# Patient Record
Sex: Female | Born: 1960 | Race: Black or African American | Hispanic: No | Marital: Married | Smoking: Never smoker
Health system: Southern US, Community
[De-identification: ages and names within clinical notes are randomized; demographics above are authoritative.]

## PROBLEM LIST (undated history)

## (undated) DIAGNOSIS — O2230 Deep phlebothrombosis in pregnancy, unspecified trimester: Secondary | ICD-10-CM

## (undated) DIAGNOSIS — G255 Other chorea: Secondary | ICD-10-CM

## (undated) HISTORY — PX: CARDIAC ELECTROPHYSIOLOGY STUDY AND ABLATION: SHX1294

## (undated) HISTORY — PX: ABDOMINAL HYSTERECTOMY: SHX81

---

## 1998-03-22 ENCOUNTER — Emergency Department (HOSPITAL_COMMUNITY): Admission: EM | Admit: 1998-03-22 | Discharge: 1998-03-22 | Payer: Self-pay | Admitting: Emergency Medicine

## 1998-10-08 ENCOUNTER — Emergency Department (HOSPITAL_COMMUNITY): Admission: EM | Admit: 1998-10-08 | Discharge: 1998-10-08 | Payer: Self-pay

## 2003-04-17 ENCOUNTER — Emergency Department (HOSPITAL_COMMUNITY): Admission: EM | Admit: 2003-04-17 | Discharge: 2003-04-17 | Payer: Self-pay | Admitting: Emergency Medicine

## 2004-01-12 ENCOUNTER — Inpatient Hospital Stay (HOSPITAL_COMMUNITY): Admission: EM | Admit: 2004-01-12 | Discharge: 2004-01-14 | Payer: Self-pay | Admitting: Emergency Medicine

## 2004-01-13 ENCOUNTER — Encounter: Payer: Self-pay | Admitting: Cardiology

## 2009-05-30 ENCOUNTER — Ambulatory Visit: Payer: Self-pay | Admitting: Interventional Radiology

## 2009-05-30 ENCOUNTER — Emergency Department (HOSPITAL_BASED_OUTPATIENT_CLINIC_OR_DEPARTMENT_OTHER): Admission: EM | Admit: 2009-05-30 | Discharge: 2009-05-31 | Payer: Self-pay | Admitting: Emergency Medicine

## 2010-06-24 LAB — DIFFERENTIAL
Basophils Absolute: 0 10*3/uL (ref 0.0–0.1)
Basophils Relative: 1 % (ref 0–1)
Eosinophils Absolute: 0 10*3/uL (ref 0.0–0.7)
Eosinophils Relative: 1 % (ref 0–5)
Lymphocytes Relative: 29 % (ref 12–46)
Lymphs Abs: 1 10*3/uL (ref 0.7–4.0)
Monocytes Absolute: 0.4 10*3/uL (ref 0.1–1.0)
Monocytes Relative: 11 % (ref 3–12)
Neutro Abs: 2.2 10*3/uL (ref 1.7–7.7)
Neutrophils Relative %: 58 % (ref 43–77)

## 2010-06-24 LAB — BASIC METABOLIC PANEL
BUN: 10 mg/dL (ref 6–23)
CO2: 29 mEq/L (ref 19–32)
Calcium: 8.8 mg/dL (ref 8.4–10.5)
Chloride: 104 mEq/L (ref 96–112)
Creatinine, Ser: 0.8 mg/dL (ref 0.4–1.2)
GFR calc Af Amer: >60 mL/min (ref >60)
GFR calc non Af Amer: >60 mL/min (ref >60)
Glucose, Bld: 118 mg/dL — ABNORMAL HIGH (ref 70–99)
Potassium: 4.3 mEq/L (ref 3.5–5.1)
Sodium: 140 mEq/L (ref 135–145)

## 2010-06-24 LAB — URINALYSIS, ROUTINE W REFLEX MICROSCOPIC
Bilirubin Urine: NEGATIVE
Ketones, ur: NEGATIVE mg/dL
Nitrite: NEGATIVE
Protein, ur: NEGATIVE mg/dL
Urobilinogen, UA: 1 mg/dL (ref 0.0–1.0)

## 2010-06-24 LAB — CBC
HCT: 39.9 % (ref 36.0–46.0)
Hemoglobin: 13.8 g/dL (ref 12.0–15.0)
MCHC: 34.5 g/dL (ref 30.0–36.0)
MCV: 85.7 fL (ref 78.0–100.0)
Platelets: 163 10*3/uL (ref 150–400)
RBC: 4.65 MIL/uL (ref 3.87–5.11)
RDW: 13.3 % (ref 11.5–15.5)
WBC: 3.6 10*3/uL — ABNORMAL LOW (ref 4.0–10.5)

## 2010-06-24 LAB — PREGNANCY, URINE: Preg Test, Ur: NEGATIVE

## 2010-08-20 NOTE — Discharge Summary (Signed)
Barbara Reynolds, Barbara Reynolds                ACCOUNT NO.:  [id number]   MEDICAL RECORD NO.:  [id number]          PATIENT TYPE:  INP   LOCATION:  3733                         FACILITY:  [facility]   PHYSICIAN:  Duke Salvia, M.D.  DATE OF BIRTH:  15-Jun-1960   DATE OF ADMISSION:  01/12/2004  DATE OF DISCHARGE:  01/14/2004                                 DISCHARGE SUMMARY   DISCHARGE DIAGNOSES:  1.  Admitted with paroxysmal supraventricular tachycardia with concurrent      symptoms:  Palpitations, weakness, diaphoresis.  2.  Finding of atrioventricular reciprocating tachycardia on      electrocardiogram this admission.  3.  Persistently elevated troponin-I studies with Zenith troponin-I of 2.18      on January 12, 2004 at 14:47 hours.  4.  Status post left heart catheterization January 13, 2004.  Study showing      normal coronary anatomy with ejection fraction equal to or greater than      60%.  5.  Consult by electrophysiology with electrophysiology studies,      supraventricular tachycardia ablation pending.   PROCEDURE:  1.  2-dimensional echocardiogram January 13, 2004.  This study showed      ejection fraction 55 to 65%, mild fibrocalcific changes of the aortic      root, mitral valve structure normal, left atrial size normal, right      ventricular size normal. There were no left ventricular regional wall      motion abnormalities.  2.  Left heart catheterization on January 13, 2004.  This study showed      normal coronary anatomy including left main free of disease, also      ejection fraction greater than or equal to 60% by Dr. Loraine Leriche Pulsipher.   DISPOSITION:  The patient is ready for discharge January 14, 2004.  She has  had no further dysrhythmias this hospitalization.  She has been started on  Toprol XL 25 mg daily and has maintained a sinus bradycardia since this  admission.  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   HISTORY OF PRESENT ILLNESS:  Briefly, Barbara Reynolds is a 50 year old female who  actively walks about 1.5 miles per day without chest pain or dyspnea.  She  has lost 60 pounds since March of 2005 on a Weight Watcher's schedule.  She  has had episodes of palpitations that she can feel and she says is a  pounding in her upper chest.  These initiate when she bends over and  straightens up.  They started about one month ago. Prior to today she has  had four different episodes.  The longest prior to today lasted five minutes  and they all stopped spontaneously.  After her third episode she presented  to her primary physician and a monitor was obtained for her.  At each of  this paroxysms she has not felt dizzy, short of breath or had catheter.  She  has no jaw or back pain.  She  is a little sore across her chest and back at  the end of the episode and a little fatigued.  However,  on the morning of  admission, January 12, 2004 at 6:15 in the morning, getting ready for work  she had just showered and her monitored was not connected. After bending  down for something she noted tachypalpitations.  These did not resolve after  a few minutes as the others had.  In fact, the total time was about 4 hours  until about 10:15 in the emergency room.  At that time she spontaneously  reverted to a sinus rhythm.  With this sustained dysrhythmia the patient did  have weakness and diaphoresis.  She is now a little fatigued on examination.  Her post palpitations soreness was present as before but that has now  resolved.  Electrocardiogram shows AVRT with retrograde P waves trailing  QRS.  She will be started on low dose beta blocker and seen by  Electrophysiology.   HOSPITAL COURSE:  After presenting to the emergency room at [facility] on January 12, 2004 with a sustained episode of paroxysmal  supraventricular tachycardia with mild  symptomatology, the patient was  admitted, started on Toprol XL 25 mg.  She has had no further dysrhythmias  this hospitalization, however, her troponin-I studies were elevated  persistently with a Zenith in the 2.18 at 14:40 hours on the day of her  admission.  Because of this she was scheduled for left heart  catheterization.  This was done on January 13, 2004 with a finding of normal  coronary anatomy and preserved left ventricular ejection fraction.  A 2-  dimensional echocardiogram confirms ejection fraction findings and shows on  evidence of wall motion abnormalities.  The patient has been seen by Dr.  Graciela Husbands this hospitalization.  He has determined that her dysrhythmia is  amenable to radiofrequency catheter ablation but he has offered her the  option of medical therapy or ablation.  The patient is still pondering this  at the time of this dictation and will go home January 14, 2004 with the  medications as follows:   DISCHARGE MEDICATIONS:  1.  Aspirin 81 mg daily.  2.  Toprol XL 25 mg daily.   ACTIVITY:  Patient is asked to avoid straining or heavy lifting for the next  two days.  She may shower.  She is to call [phone] if she experiences pain  or swelling at the catheterization site.   FOLLOW UP:  Patient will have a two week follow up appointment post  catheterization at the office of [facility] in [place] an Dr.  Graciela Husbands will schedule her if she is amenable for radiofrequency ablation in  the future.       GM/MEDQ  D:  01/14/2004  T:  01/14/2004  Job:  [id number]   cc:   Dr. Asencion Gowda  [place]

## 2010-08-20 NOTE — Cardiovascular Report (Signed)
NAMEFRANNIE, SHEDRICK                ACCOUNT NO.:  [id number]   MEDICAL RECORD NO.:  [id number]          PATIENT TYPE:  INP   LOCATION:  3733                         FACILITY:  [facility]   PHYSICIAN:  Carole Binning, M.D. LHCDATE OF BIRTH:  [birth year]   DATE OF PROCEDURE:  01/13/2004  DATE OF DISCHARGE:                              CARDIAC CATHETERIZATION   PROCEDURE PERFORMED:  Left heart catheterization with coronary angiography  and left ventriculography.   INDICATION:  Ms. Christell Constant is a 50 year old woman who presented with  supraventricular tachycardia.  She also had elevated cardiac markers  including elevated CK-MB and troponin I.  She was thus referred for cardiac  catheterization to rule out coronary artery disease.   PROCEDURE:  A 6 French sheath was placed in the right femoral artery.  Coronary angiography was performed with standard Judkins 6 French catheters.  Left ventriculography was performed with an angled pigtail catheter.  Contrast was Omnipaque.  There were no complications.   RESULTS/HEMODYNAMICS:  Left ventricular pressure 122/15, aortic pressure  124/74.  There was no aortic valve gradient.   Left ventriculography -- wall motion is normal, ejection fraction estimated  at greater than or equal to 60%.  There was no mitral regurgitation.   Coronary arteriography (right dominant).   Left main is normal.   Left anterior descending artery gives rise to a large first diagonal branch  and a small second diagonal branch.  The LAD is normal.   Left circumflex gives rise to a small first obtuse marginal branch and a  large second obtuse marginal branch.  The left circumflex is normal.   Right coronary artery is a dominant vessel giving rise to a small posterior  descending artery and three small posterolateral branches.  The right  coronary artery is normal.   IMPRESSIONS:  1.  Normal left ventricular systolic function.  2.  Angiographically normal coronary  arteries.       MWP/MEDQ  D:  01/13/2004  T:  01/13/2004  Job:  [id number]   cc:   Asencion Gowda, MD  [facility]   Duke Salvia, M.D.

## 2010-08-20 NOTE — H&P (Signed)
NAMECHESNEE, FLOREN NO.:  [id number]   MEDICAL RECORD NO.:  [id number]          PATIENT TYPE:  INP   LOCATION:  1849                         FACILITY:  [facility]   PHYSICIAN:  Jonelle Sidle, M.D. LHCDATE OF BIRTH:  [birth year]   DATE OF ADMISSION:  01/12/2004  DATE OF DISCHARGE:                                HISTORY & PHYSICAL   PRIMARY CARE PHYSICIAN:  Asencion Gowda, M.D. at [facility].   CHIEF COMPLAINT:  Rapid palpitations.   HISTORY OF PRESENT ILLNESS:  Barbara Reynolds is a very pleasant 50 year old woman  with a history of obesity and more recently complaints of palpitations in an  intermittent pattern over the last month.  She states that she has been  experiencing this sensation of rapid palpitations, typically only lasting  for a few minutes at a time when she bends over to pick up something.  She  has not experienced any of these symptoms at any other time and otherwise is  having no exertional chest discomfort, shortness of breath, or palpitations.  The first few episodes she experienced lasting anywhere from one minute to  five minutes and she mentioned this to Dr. Dimple Casey who provided her with an  event recorder which she has been using the last two weeks without any  recurrent symptoms until this morning.   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  This particular episode lasted for  quite some time ultimately resulting in weakness and diaphoresis.   She presented to the emergency department where an electrocardiogram at 9:12  this morning showed a narrow complex tachycardia at 158 beats per minute.  The patient spontaneously converted to normal sinus rhythm without receiving  any medications, although it was planned for her to receive a beta blocker.  In reviewing her resting  electrocardiogram, she does have a short PR  interval of 152 msec with somewhat of a slurring of the initial portion of  the QRS reminiscent of a delta wave, although not entirely classic for this.  Her tracing during tachycardia does show intermittent beats that look to be  conducted through a different pathway and one does note retrograde P-waves  just outside the QRS raising the possibility of an accessory pathway just  outside the AV node.   Barbara Reynolds has not had any chest discomfort as part of her syndrome and  states that she has been working with [employer] and lost approximately  60 pounds, also associated with regular exercise.  She walks a mile and a  half most days of the week is bothered by no symptoms.  Her initial troponin  I level is mildly elevated at 0.13 with a CK-MB of 3.9 and a myoglobin of  143.  She is entirely symptom-free at this time.   ALLERGIES:  CODEINE and ERYTHROMYCIN.   MEDICATIONS:  None chronically.   PAST MEDICAL HISTORY:  1.  Status post tubal ligation.  2.  No clear history of type 2 diabetes mellitus, hypertension,      dyslipidemia, previously documented dysrhythmia, but no coronary artery      disease or congestive heart failure.  3.  The patient tells me that she recently had blood work obtained by her      primary care physician and was told that her thyroid status was normal.   SOCIAL HISTORY:  The patient lives in [place] with her husband.  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  She has no significant tobacco or  alcohol use history.  She denies significant caffeine use.   FAMILY HISTORY:  Significant for hypertension and type 2 diabetes mellitus.  No known dysrhythmias.   REVIEW OF SYMPTOMS:  As in history of present illness.  She has rare  indigestion.  Otherwise systems are negative.   PHYSICAL EXAMINATION:  VITAL SIGNS:  Temperature is 97.4, heart rate is 72  and regular, respirations are 20, blood pressure is 108/60.   Oxygen  saturation is 100% on two liters nasal cannula.  GENERAL:  This is an obese woman seated in bed in no acute distress.  HEENT:  Conjunctivae is normal.  Oropharynx is clear.  NECK:  Supple without elevated jugular venous pressure or lateral carotid  bruits.  No thyromegaly is noted.  LUNGS:  Clear to auscultation bilaterally.  CARDIOVASCULAR:  Regular rate and rhythm without loud murmur or S3 gallop.  There is no pericardial rub.  ABDOMEN:  Soft with normal active bowel sounds.  There is no tenderness to  palpation.  EXTREMITIES:  No pitting edema.  Peripheral pulses are 2+.  SKIN:  No ulcerative changes are noted.  MUSCULOSKELETAL:  No kyphosis noted.  NEUROLOGICAL:  The patient is alert and oriented x 3.   LABORATORY DATA:  Chest x-ray is currently pending.   Initial troponin I is 0.14, hemoglobin 16, hematocrit 47.  Sodium 138,  potassium 4.0, BUN 14, creatinine pending.  Glucose 88.   IMPRESSION:  1.  Narrow complex supraventricular tachycardia likely via an accessory      pathway as described:  I suspect that this is extranodal based on the      resting tachycardia tracings.  Most recent event was prolonged lasting      at least three hours and symptomatic and I suspect that the mildly      elevated troponin level is due to this rather than an acute coronary      syndrome.  Reportedly, thyroid status was evaluated within the last few      weeks and found to be normal per the patient.  2.  Obesity with 60 pound weight loss via diet and exercise over the last      six months.   PLAN:  1.  We will admit the patient to telemetry and complete a full set of      cardiac markers to follow trend in troponin I.  2.  We will ask electrophysiology to evaluate the patient with new tracings      and discuss treatment options.  Ischemic testing will be needed as well      given troponin I levels, although can likely consider a Cardiolite. 3.  We will continue Toprol for the time  being.  Further plans to follow.       SGM/MEDQ  D:  01/12/2004  T:  01/12/2004  Job:  [id number]

## 2016-06-23 ENCOUNTER — Emergency Department (HOSPITAL_BASED_OUTPATIENT_CLINIC_OR_DEPARTMENT_OTHER)
Admission: EM | Admit: 2016-06-23 | Discharge: 2016-06-23 | Disposition: A | Discharge status: Home / Self Care | Payer: BC Managed Care – PPO | Attending: Emergency Medicine | Admitting: Emergency Medicine

## 2016-06-23 ENCOUNTER — Encounter (HOSPITAL_BASED_OUTPATIENT_CLINIC_OR_DEPARTMENT_OTHER): Payer: Self-pay | Admitting: Emergency Medicine

## 2016-06-23 ENCOUNTER — Emergency Department (HOSPITAL_BASED_OUTPATIENT_CLINIC_OR_DEPARTMENT_OTHER): Payer: BC Managed Care – PPO

## 2016-06-23 DIAGNOSIS — R0789 Other chest pain: Secondary | ICD-10-CM | POA: Insufficient documentation

## 2016-06-23 DIAGNOSIS — R05 Cough: Secondary | ICD-10-CM | POA: Insufficient documentation

## 2016-06-23 DIAGNOSIS — Z7901 Long term (current) use of anticoagulants: Secondary | ICD-10-CM | POA: Insufficient documentation

## 2016-06-23 HISTORY — DX: Deep phlebothrombosis in pregnancy, unspecified trimester: O22.30

## 2016-06-23 HISTORY — DX: Other chorea: G25.5

## 2016-06-23 LAB — BASIC METABOLIC PANEL
Anion gap: 6 (ref 5–15)
BUN: 22 mg/dL — AB (ref 6–20)
CALCIUM: 8.9 mg/dL (ref 8.9–10.3)
CO2: 26 mmol/L (ref 22–32)
Chloride: 110 mmol/L (ref 101–111)
Creatinine, Ser: 0.88 mg/dL (ref 0.44–1.00)
GFR calc Af Amer: >60 mL/min (ref >60)
GLUCOSE: 95 mg/dL (ref 65–99)
Potassium: 4.1 mmol/L (ref 3.5–5.1)
Sodium: 142 mmol/L (ref 135–145)

## 2016-06-23 LAB — CBC WITH DIFFERENTIAL/PLATELET
Basophils Absolute: 0 10*3/uL (ref 0.0–0.1)
Basophils Relative: 0 %
Eosinophils Absolute: 0.1 10*3/uL (ref 0.0–0.7)
Eosinophils Relative: 2 %
HEMATOCRIT: 42.1 % (ref 36.0–46.0)
HEMOGLOBIN: 14.1 g/dL (ref 12.0–15.0)
LYMPHS ABS: 2.5 10*3/uL (ref 0.7–4.0)
LYMPHS PCT: 35 %
MCH: 28.7 pg (ref 26.0–34.0)
MCHC: 33.5 g/dL (ref 30.0–36.0)
MCV: 85.6 fL (ref 78.0–100.0)
MONOS PCT: 10 %
Monocytes Absolute: 0.7 10*3/uL (ref 0.1–1.0)
NEUTROS ABS: 3.8 10*3/uL (ref 1.7–7.7)
Neutrophils Relative %: 53 %
Platelets: 187 10*3/uL (ref 150–400)
RBC: 4.92 MIL/uL (ref 3.87–5.11)
RDW: 14.6 % (ref 11.5–15.5)
WBC: 7.1 10*3/uL (ref 4.0–10.5)

## 2016-06-23 LAB — D-DIMER, QUANTITATIVE (NOT AT ARMC)

## 2016-06-23 LAB — PROTIME-INR
INR: 3.28
Prothrombin Time: 34.1 seconds — ABNORMAL HIGH (ref 11.4–15.2)

## 2016-06-23 NOTE — ED Notes (Signed)
ED Provider at bedside. 

## 2016-06-23 NOTE — ED Triage Notes (Signed)
Patient states that she woke up about an hour ago from the pain in her chest. The patient reports that it is aching in her chest - history of DVT on coumadin. The patient denies any N/V/D or SOB - the patient reports a recent cold

## 2016-06-23 NOTE — ED Provider Notes (Signed)
[facility] Provider Note   CSN: [id number] Arrival date & time: 06/23/16  0447     History   Chief Complaint Chief Complaint  Patient presents with  . Chest Pain    HPI Barbara Reynolds is a 56 y.o. female with a past medical history of DVT, currently on Coumadin, presenting today with chest pain. Patient states she woke up an hour and half prior to arrival with sharp chest pain across the front of her chest. She has mild chest pain in her back as well. She denies any shortness of breath or pleuritic chest pain. No vomiting or sweating associated with this. This is in the setting of recent viral URI with productive cough. She is concerned for DVT may have moved to a PE. She has been compliant with her Coumadin and her last INR was 2.8, this was done 8 days ago. She is due to have this checked again next week. Currently the pain has improved without any acute interventions. Patient has no further complaints.  10 Systems reviewed and are negative for acute change except as noted in the HPI.    HPI  Past Medical History:  Diagnosis Date  . DVT (deep vein thrombosis) in pregnancy (HCC)   . 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   Past Surgical History:  Procedure Laterality Date  . ABDOMINAL HYSTERECTOMY    . CARDIAC ELECTROPHYSIOLOGY STUDY AND ABLATION      OB History    No data available       Home Medications    Prior to Admission medications   Medication Sig Start Date End Date Taking? Authorizing Provider  warfarin (COUMADIN) 10 MG tablet Take 10 mg by mouth daily.   Yes Historical Provider, MD    Family History History reviewed. No pertinent family history.  Social History Social History  Substance Use Topics  . Smoking status: Never Smoker  . Smokeless tobacco: Never Used  . Alcohol use No     Allergies   Asa [aspirin]; Codeine; Decongestant [oxymetazoline]; and  Erythromycin   Review of Systems Review of Systems   Physical Exam Updated Vital Signs BP 131/90 (BP Location: Right Arm)   Pulse 84   Temp 98.3 F (36.8 C) (Oral)   Resp 20   SpO2 100%   Physical Exam  Constitutional: She is oriented to person, place, and time. She appears well-developed and well-nourished. No distress.  HENT:  Head: Normocephalic and atraumatic.  Nose: Nose normal.  Mouth/Throat: Oropharynx is clear and moist. No oropharyngeal exudate.  Eyes: Conjunctivae and EOM are normal. Pupils are equal, round, and reactive to light. No scleral icterus.  Neck: Normal range of motion. Neck supple. No JVD present. No tracheal deviation present. No thyromegaly present.  Cardiovascular: Normal rate, regular rhythm and normal heart sounds.  Exam reveals no gallop and no friction rub.   No murmur heard. Pulmonary/Chest: Effort normal and breath sounds normal. No respiratory distress. She has no wheezes. She exhibits no tenderness.  Abdominal: Soft. Bowel sounds are normal. She exhibits no distension and no mass. There is no tenderness. There is no rebound and no guarding.  Musculoskeletal: Normal range of motion. She exhibits no edema or tenderness.  Lymphadenopathy:    She has no cervical adenopathy.  Neurological: She is alert and oriented to person, place, and time. No cranial nerve deficit. She exhibits normal muscle tone.  Skin: Skin is  warm and dry. No rash noted. No erythema. No pallor.  Nursing note and vitals reviewed.    ED Treatments / Results  Labs (all labs ordered are listed, but only abnormal results are displayed) Labs Reviewed  BASIC METABOLIC PANEL - Abnormal; Notable for the following:       Result Value   BUN 22 (*)    All other components within normal limits  PROTIME-INR - Abnormal; Notable for the following:    Prothrombin Time 34.1 (*)    All other components within normal limits  CBC WITH DIFFERENTIAL/PLATELET  D-DIMER, QUANTITATIVE ([facility])    EKG  EKG Interpretation  Date/Time:  Thursday June 23 2016 04:55:29 EDT Ventricular Rate:  91 PR Interval:    QRS Duration: 98 QT Interval:  345 QTC Calculation: 425 R Axis:   64 Text Interpretation:  Sinus rhythm Probable left atrial enlargement rate is faster Confirmed by Erroll Lunani, Mckenlee Mangham Ayokunle [phone]([id number]) on 06/23/2016 5:07:27 AM       Radiology Dg Chest 2 View  Result Date: 06/23/2016 CLINICAL DATA:  56 year old female with chest pain and productive cough. History of DVT. EXAM: CHEST  2 VIEW COMPARISON:  Chest radiograph dated 05/30/2009 FINDINGS: The lungs are clear. There is no pleural effusion or pneumothorax the cardiac silhouette is within normal limits. There is degenerative changes of the spine. No acute osseous pathology. IMPRESSION: No active cardiopulmonary disease. Electronically Signed   By: Elgie CollardArash  Radparvar M.D.   On: 06/23/2016 05:35    Procedures Procedures (including critical care time)  Medications Ordered in ED Medications - No data to display   Initial Impression / Assessment and Plan / ED Course  I have reviewed the triage vital signs and the nursing notes.  Pertinent labs & imaging results that were available during my care of the patient were reviewed by me and considered in my medical decision making (see chart for details).     Patient presents to the emergency department for chest pain. Her history is not consistent with ACS. EKG does not show any signs of ischemia or signs of pulmonary embolism. She is compliant with her Coumadin and thus is on proper treatment. Will obtain a d-dimer, Patient may need a CT scan if this is positive. Chest x-ray is pending currently. We'll continue to monitor. She has no tachycardia or hypoxia currently.  6:00 AM dimer is negative. She has follow up in 1 day for INR check.  Advised to hold INR for one dose because it is slightly high. She demonstrates good understanding of the plan. She appears well and in NAD.  VS remain within her normal limits and she I ssafe for DC.  Final Clinical Impressions(s) / ED Diagnoses   Final diagnoses:  Chest wall pain    New Prescriptions New Prescriptions   No medications on file     Tomasita CrumbleAdeleke Christl Fessenden, MD 06/23/16 [phone]

## 2017-04-26 ENCOUNTER — Telehealth: Payer: Self-pay | Admitting: *Deleted

## 2017-04-26 NOTE — Telephone Encounter (Signed)
Did not need this encounter °

## 2018-11-04 ENCOUNTER — Emergency Department (HOSPITAL_BASED_OUTPATIENT_CLINIC_OR_DEPARTMENT_OTHER)
Admission: EM | Admit: 2018-11-04 | Discharge: 2018-11-04 | Disposition: A | Discharge status: Home / Self Care | Payer: BC Managed Care – PPO | Attending: Emergency Medicine | Admitting: Emergency Medicine

## 2018-11-04 ENCOUNTER — Encounter (HOSPITAL_BASED_OUTPATIENT_CLINIC_OR_DEPARTMENT_OTHER): Payer: Self-pay | Admitting: Emergency Medicine

## 2018-11-04 ENCOUNTER — Emergency Department (HOSPITAL_BASED_OUTPATIENT_CLINIC_OR_DEPARTMENT_OTHER): Payer: BC Managed Care – PPO

## 2018-11-04 ENCOUNTER — Other Ambulatory Visit: Payer: Self-pay

## 2018-11-04 DIAGNOSIS — R072 Precordial pain: Secondary | ICD-10-CM | POA: Diagnosis not present

## 2018-11-04 DIAGNOSIS — Y9389 Activity, other specified: Secondary | ICD-10-CM | POA: Insufficient documentation

## 2018-11-04 DIAGNOSIS — R0789 Other chest pain: Secondary | ICD-10-CM | POA: Diagnosis not present

## 2018-11-04 DIAGNOSIS — R079 Chest pain, unspecified: Secondary | ICD-10-CM | POA: Diagnosis present

## 2018-11-04 DIAGNOSIS — X500XXA Overexertion from strenuous movement or load, initial encounter: Secondary | ICD-10-CM | POA: Diagnosis not present

## 2018-11-04 DIAGNOSIS — S29012A Strain of muscle and tendon of back wall of thorax, initial encounter: Secondary | ICD-10-CM | POA: Diagnosis not present

## 2018-11-04 DIAGNOSIS — Z7901 Long term (current) use of anticoagulants: Secondary | ICD-10-CM | POA: Insufficient documentation

## 2018-11-04 DIAGNOSIS — S46819A Strain of other muscles, fascia and tendons at shoulder and upper arm level, unspecified arm, initial encounter: Secondary | ICD-10-CM

## 2018-11-04 DIAGNOSIS — Z79899 Other long term (current) drug therapy: Secondary | ICD-10-CM | POA: Diagnosis not present

## 2018-11-04 DIAGNOSIS — Y9289 Other specified places as the place of occurrence of the external cause: Secondary | ICD-10-CM | POA: Insufficient documentation

## 2018-11-04 DIAGNOSIS — Y999 Unspecified external cause status: Secondary | ICD-10-CM | POA: Insufficient documentation

## 2018-11-04 LAB — CBC
HCT: 42.8 % (ref 36.0–46.0)
Hemoglobin: 13.6 g/dL (ref 12.0–15.0)
MCH: 28.5 pg (ref 26.0–34.0)
MCHC: 31.8 g/dL (ref 30.0–36.0)
MCV: 89.7 fL (ref 80.0–100.0)
Platelets: 160 10*3/uL (ref 150–400)
RBC: 4.77 MIL/uL (ref 3.87–5.11)
RDW: 14.6 % (ref 11.5–15.5)
WBC: 4.7 10*3/uL (ref 4.0–10.5)
nRBC: 0 % (ref 0.0–0.2)

## 2018-11-04 LAB — TROPONIN I (HIGH SENSITIVITY)
Troponin I (High Sensitivity): 3 ng/L (ref <18)
Troponin I (High Sensitivity): 3 ng/L (ref <18)

## 2018-11-04 LAB — BASIC METABOLIC PANEL
Anion gap: 8 (ref 5–15)
BUN: 15 mg/dL (ref 6–20)
CO2: 24 mmol/L (ref 22–32)
Calcium: 9 mg/dL (ref 8.9–10.3)
Chloride: 111 mmol/L (ref 98–111)
Creatinine, Ser: 0.75 mg/dL (ref 0.44–1.00)
GFR calc Af Amer: >60 mL/min (ref >60)
GFR calc non Af Amer: >60 mL/min (ref >60)
Glucose, Bld: 100 mg/dL — ABNORMAL HIGH (ref 70–99)
Potassium: 3.9 mmol/L (ref 3.5–5.1)
Sodium: 143 mmol/L (ref 135–145)

## 2018-11-04 MED ORDER — METHOCARBAMOL 750 MG PO TABS: 750.0000 mg | ORAL_TABLET | Freq: Three times a day (TID) | ORAL | 0 refills | Status: AC | PRN

## 2018-11-04 NOTE — ED Triage Notes (Signed)
Generalized chest pain radiating into shoulders and back x 1 week. Endorses dyspnea with exertion.

## 2018-11-04 NOTE — ED Notes (Signed)
2 unsuccessful IV attempts.

## 2018-11-04 NOTE — ED Provider Notes (Signed)
[facility] EMERGENCY DEPARTMENT Provider Note   CSN: [id number] Arrival date & time: 11/04/18  1033     History   Chief Complaint Chief Complaint  Patient presents with  . Chest Pain    HPI Barbara Reynolds is a 58 y.o. female.     Patient c/o pain across bil trapezius area and upper chest for the past couple days. Pain gradual onset, constant, moderate, non radiating, worse w heavy lifting and palpation area. Pain is not pleuritic. No exertional chest pain or discomfort. No unusual doe or fatigue. No sob. No nv or diaphoresis. No leg pain or swelling. No recent surgery, immobility, trauma or travel. No heartburn. States has been doing more lifting as of late, but denies specific injury or strain. No cough or uri symptoms. No known covid+ exposure. No skin changes, rash or swelling to area.   The history is provided by the patient.  Chest Pain Associated symptoms: back pain   Associated symptoms: no abdominal pain, no cough, no fever, no headache, no nausea, no numbness, no shortness of breath, no vomiting and no weakness     Past Medical History:  Diagnosis Date  . DVT (deep vein thrombosis) in pregnancy   . 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   Past Surgical History:  Procedure Laterality Date  . ABDOMINAL HYSTERECTOMY    . CARDIAC ELECTROPHYSIOLOGY STUDY AND ABLATION       OB History   No obstetric history on file.      Home Medications    Prior to Admission medications   Medication Sig Start Date End Date Taking? Authorizing Provider  topiramate (TOPAMAX) 50 MG tablet Take 50 mg by mouth 2 (two) times daily.   Yes [provider]  warfarin (COUMADIN) 10 MG tablet Take 10 mg by mouth daily.    [provider]    Family History No family history on file.  Social History Social History   Tobacco Use  . Smoking status: Never Smoker  . Smokeless  tobacco: Never Used  Substance Use Topics  . Alcohol use: No  . Drug use: No     Allergies   Asa [aspirin], Codeine, Decongestant [oxymetazoline], and Erythromycin   Review of Systems Review of Systems  Constitutional: Negative for chills and fever.  HENT: Negative for sore throat.   Eyes: Negative for redness.  Respiratory: Negative for cough and shortness of breath.   Cardiovascular: Positive for chest pain. Negative for leg swelling.  Gastrointestinal: Negative for abdominal pain, nausea and vomiting.  Genitourinary: Negative for flank pain.  Musculoskeletal: Positive for back pain. Negative for neck pain.  Skin: Negative for rash.  Neurological: Negative for weakness, numbness and headaches.  Hematological: Does not bruise/bleed easily.  Psychiatric/Behavioral: Negative for confusion.     Physical Exam Updated Vital Signs BP 136/67 (BP Location: Right Arm)   Pulse 66   Temp 98.3 F (36.8 C) (Oral)   Resp 20   Wt 122.6 kg   Physical Exam Vitals signs and nursing note reviewed.  Constitutional:      Appearance: Normal appearance. She is well-developed.  HENT:     Head: Atraumatic.     Nose: Nose normal.     Mouth/Throat:     Mouth: Mucous membranes are moist.  Eyes:     General: No scleral icterus.    Conjunctiva/sclera: Conjunctivae normal.  Neck:     Musculoskeletal:  Normal range of motion and neck supple. No neck rigidity or muscular tenderness.     Trachea: No tracheal deviation.  Cardiovascular:     Rate and Rhythm: Normal rate and regular rhythm.     Pulses: Normal pulses.     Heart sounds: Normal heart sounds. No murmur. No friction rub. No gallop.   Pulmonary:     Effort: Pulmonary effort is normal. No respiratory distress.     Breath sounds: Normal breath sounds.     Comments: +chest and bil trapezius tenderness reproducing symptoms.  Chest:     Chest wall: Tenderness present.  Abdominal:     General: Bowel sounds are normal. There is no  distension.     Palpations: Abdomen is soft.     Tenderness: There is no abdominal tenderness. There is no guarding.  Genitourinary:    Comments: No cva tenderness.  Musculoskeletal:        General: No swelling or tenderness.     Right lower leg: No edema.     Left lower leg: No edema.     Comments: C/T spine non tender. Aligned. bil trap muscular tenderness.   Skin:    General: Skin is warm and dry.     Findings: No rash.  Neurological:     Mental Status: She is alert.     Comments: Alert, speech normal.   Psychiatric:        Mood and Affect: Mood normal.      ED Treatments / Results  Labs (all labs ordered are listed, but only abnormal results are displayed) Results for orders placed or performed during the hospital encounter of 11/04/18  Basic metabolic panel  Result Value Ref Range   Sodium 143 135 - 145 mmol/L   Potassium 3.9 3.5 - 5.1 mmol/L   Chloride 111 98 - 111 mmol/L   CO2 24 22 - 32 mmol/L   Glucose, Bld 100 (H) 70 - 99 mg/dL   BUN 15 6 - 20 mg/dL   Creatinine, Ser 1.610.75 0.44 - 1.00 mg/dL   Calcium 9.0 8.9 - 09.610.3 mg/dL   GFR calc non Af Amer >60 >60 mL/min   GFR calc Af Amer >60 >60 mL/min   Anion gap 8 5 - 15  CBC  Result Value Ref Range   WBC 4.7 4.0 - 10.5 K/uL   RBC 4.77 3.87 - 5.11 MIL/uL   Hemoglobin 13.6 12.0 - 15.0 g/dL   HCT 04.542.8 40.936.0 - 81.146.0 %   MCV 89.7 80.0 - 100.0 fL   MCH 28.5 26.0 - 34.0 pg   MCHC 31.8 30.0 - 36.0 g/dL   RDW 91.414.6 78.211.5 - 95.615.5 %   Platelets 160 150 - 400 K/uL   nRBC 0.0 0.0 - 0.2 %  Troponin I (High Sensitivity)  Result Value Ref Range   Troponin I (High Sensitivity) 3 <18 ng/L  Troponin I (High Sensitivity)  Result Value Ref Range   Troponin I (High Sensitivity) 3 <18 ng/L    EKG EKG Interpretation  Date/Time:  Sunday November 04 2018 10:44:39 EDT Ventricular Rate:  61 PR Interval:    QRS Duration: 99 QT Interval:  402 QTC Calculation: 405 R Axis:   81 Text Interpretation:  Sinus rhythm No significant change  since last tracing Confirmed by Cathren LaineSteinl, Maryana Pittmon ([id number]) on 11/04/2018 10:51:19 AM   Radiology Dg Chest 2 View  Result Date: 11/04/2018 CLINICAL DATA:  Chest pain and dyspnea for 6 days EXAM: CHEST - 2 VIEW COMPARISON:  June 23, 2016 FINDINGS: The heart size and mediastinal contours are within normal limits. Both lungs are clear. The visualized skeletal structures are. Stable. IMPRESSION: No active cardiopulmonary disease. Electronically Signed   By: Abelardo Diesel M.D.   On: 11/04/2018 11:50    Procedures Procedures (including critical care time)  Medications Ordered in ED Medications - No data to display   Initial Impression / Assessment and Plan / ED Course  I have reviewed the triage vital signs and the nursing notes.  Pertinent labs & imaging results that were available during my care of the patient were reviewed by me and considered in my medical decision making (see chart for details).  Iv ns. Ecg. Cxr. Labs.   Reviewed nursing notes and prior charts for additional history.   Labs reviewed by me - trop is normal.   CXR reviewed by me - no pna.   Motrin po. Robaxin po.   Delta trop is normal.  Symptoms felt most c/w msk pain.   Rec pcp f/u.  Return precautions provided.     Final Clinical Impressions(s) / ED Diagnoses   Final diagnoses:  None    ED Discharge Orders    None       Lajean Saver, MD 11/04/18 [phone]

## 2018-11-04 NOTE — Discharge Instructions (Addendum)
It was our pleasure to provide your ER care today - we hope that you feel better.  Take ibuprofen or aleve as need. You may also take robaxin as need for muscle pain/spasm - no driving when taking.  Follow up with primary care doctor in the next 1-2 weeks.   Return to ER if worse, new symptoms, fevers, trouble breathing, persistent/recurrent chest pain, other concern.

## 2018-11-04 NOTE — ED Notes (Signed)
Delay in xray. Second attempt to get pt. RN drawing labs.

## 2020-04-06 IMAGING — CR CHEST - 2 VIEW
2 series · 2 of 2 positions shown · non-contrast
Comparison: June 23, 2016

CLINICAL DATA: Chest pain and dyspnea for 6 days

EXAM:
CHEST - 2 VIEW

[w chest pa]
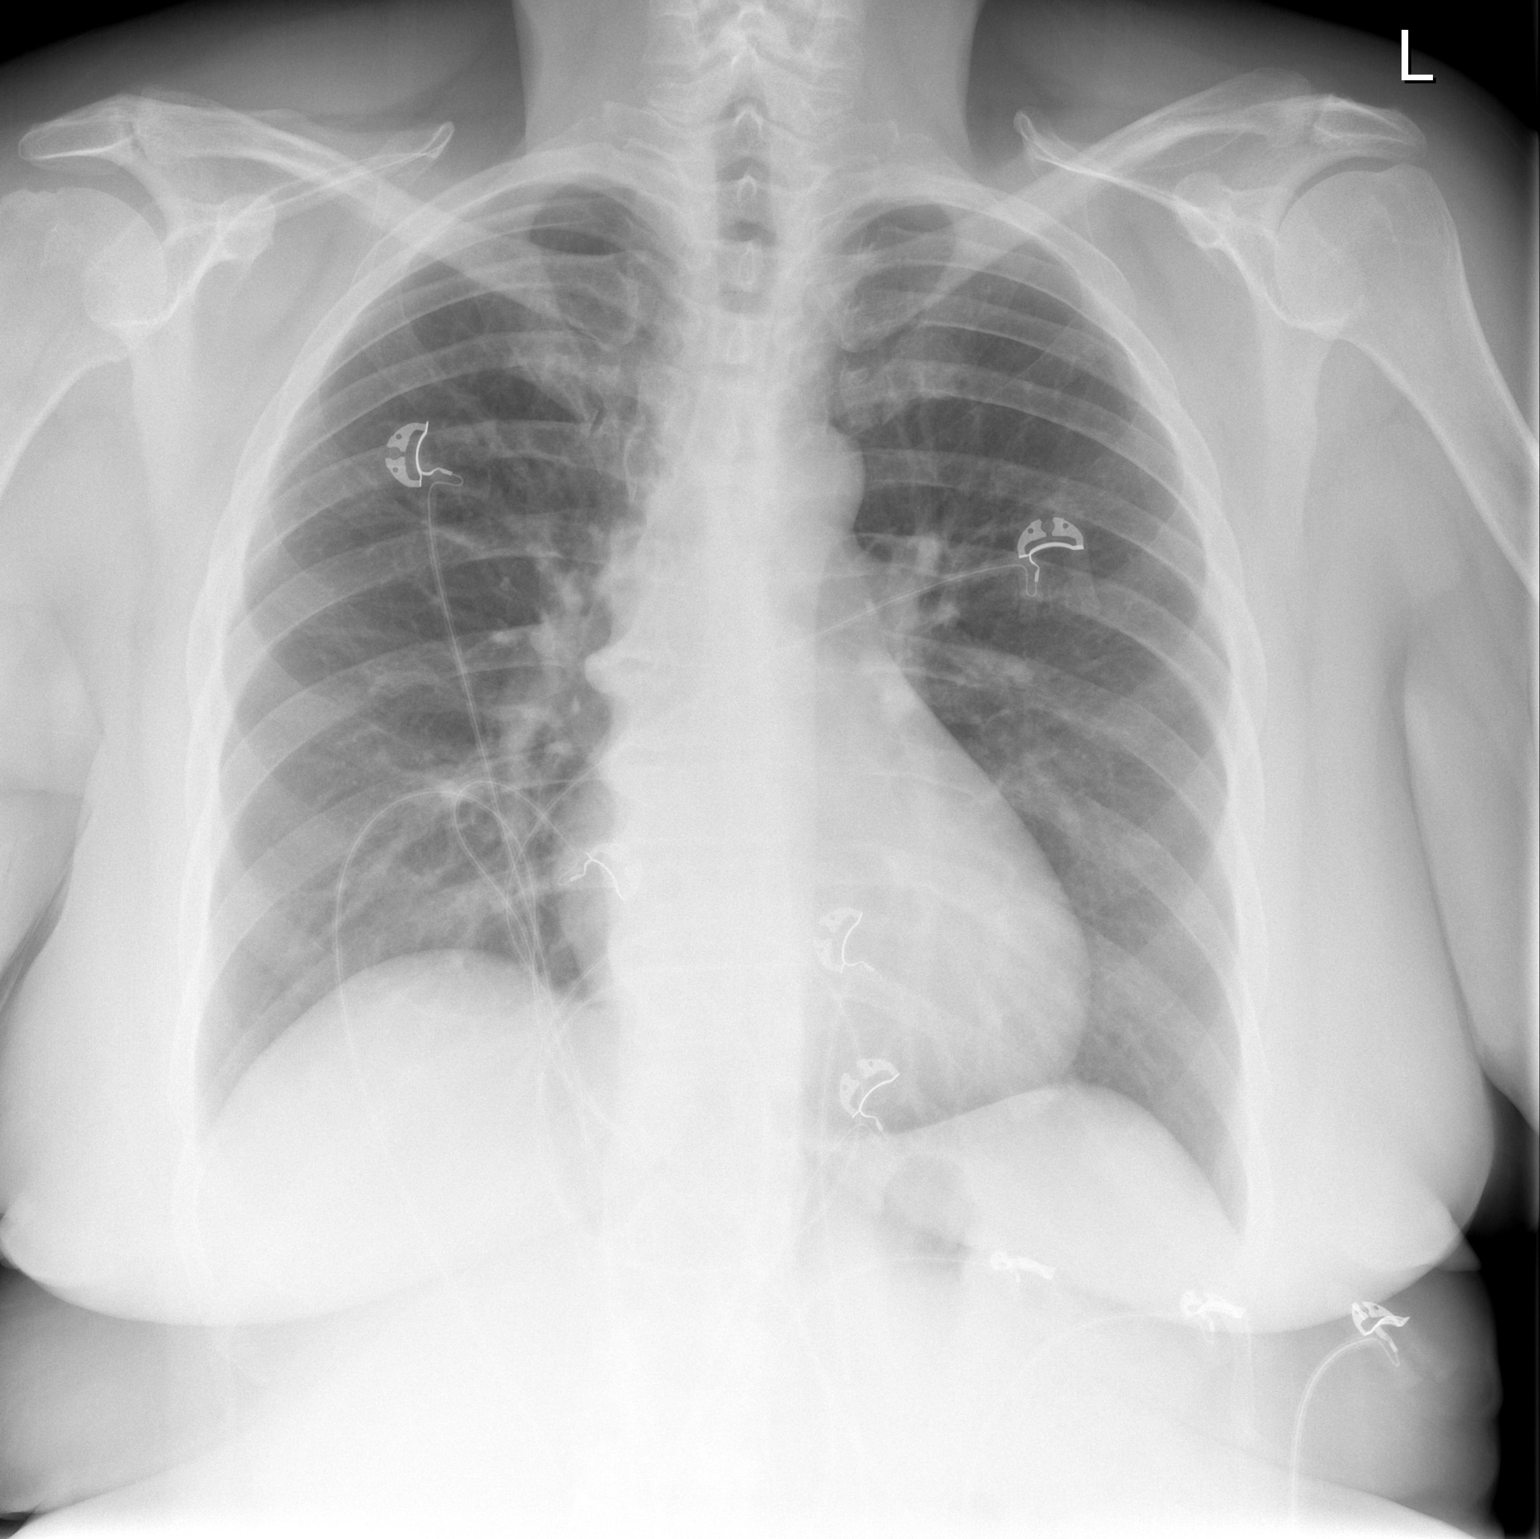

[w chest lat]
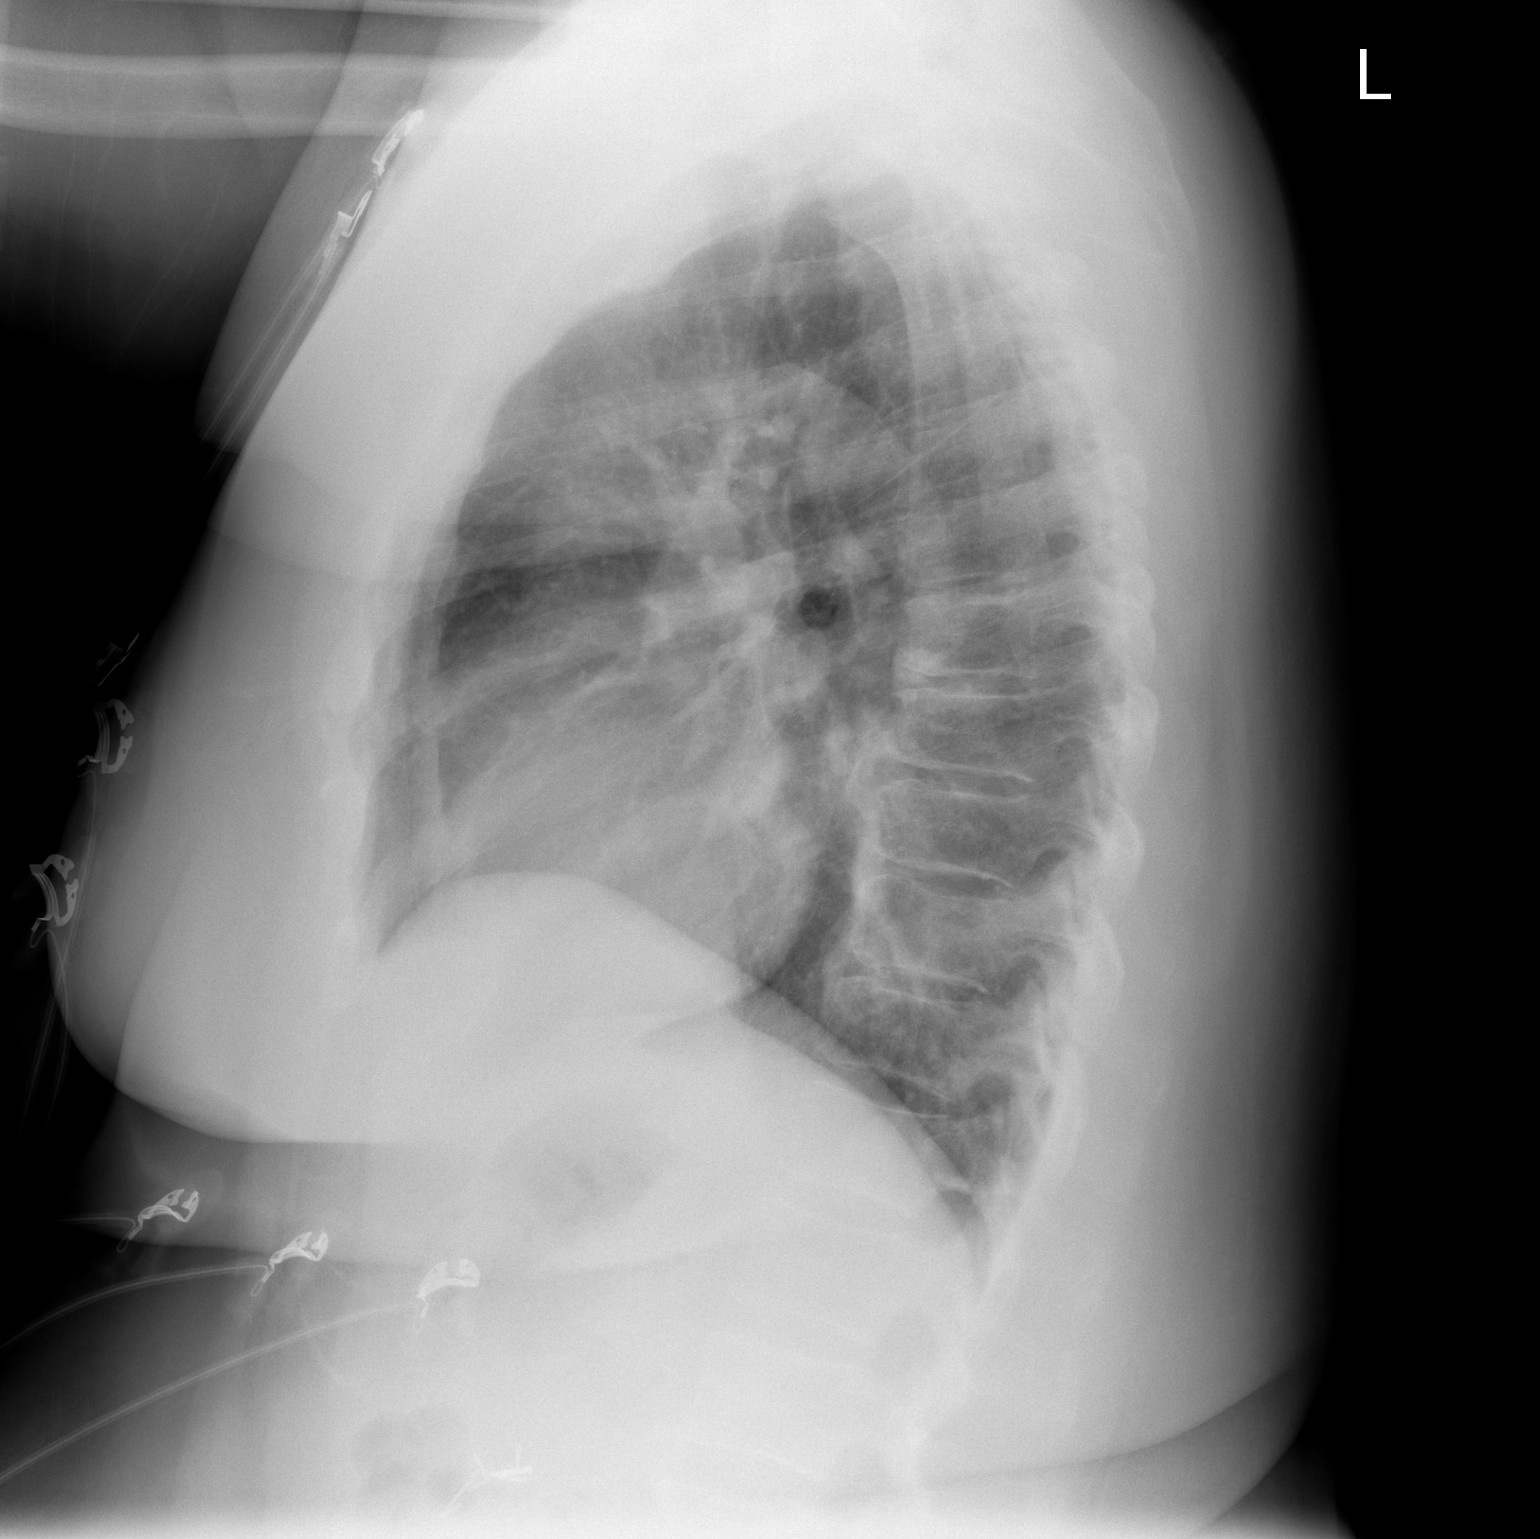

[2 of 2 positions shown; findings below may reference images not displayed]

FINDINGS: The heart size and mediastinal contours are within normal limits.
Both lungs are clear. The visualized skeletal structures are.
Stable.
IMPRESSION: No active cardiopulmonary disease.

## 2022-01-26 ENCOUNTER — Other Ambulatory Visit (HOSPITAL_COMMUNITY): Payer: Self-pay

## 2022-01-26 MED ORDER — SAXENDA 18 MG/3ML ~~LOC~~ SOPN
3.0000 mg | PEN_INJECTOR | Freq: Every day | SUBCUTANEOUS | 0 refills | Status: AC
  Filled 2022-01-26 – 2022-05-06 (×4): qty 15, 30d supply, fill #0

## 2022-01-27 ENCOUNTER — Other Ambulatory Visit (HOSPITAL_COMMUNITY): Payer: Self-pay

## 2022-01-27 MED ORDER — SAXENDA 18 MG/3ML ~~LOC~~ SOPN
3.0000 mg | PEN_INJECTOR | Freq: Every day | SUBCUTANEOUS | 0 refills | Status: AC
  Filled 2022-01-27: qty 45, 90d supply, fill #0

## 2022-03-30 ENCOUNTER — Other Ambulatory Visit (HOSPITAL_COMMUNITY): Payer: Self-pay

## 2022-03-31 ENCOUNTER — Other Ambulatory Visit (HOSPITAL_COMMUNITY): Payer: Self-pay

## 2022-03-31 MED ORDER — SAXENDA 18 MG/3ML ~~LOC~~ SOPN
3.0000 mg | PEN_INJECTOR | Freq: Every day | SUBCUTANEOUS | 0 refills | Status: AC
  Filled 2022-03-31 – 2022-04-05 (×2): qty 15, 30d supply, fill #0
  Filled 2022-07-03: qty 15, 30d supply, fill #1

## 2022-04-05 ENCOUNTER — Other Ambulatory Visit (HOSPITAL_COMMUNITY): Payer: Self-pay

## 2022-04-06 ENCOUNTER — Other Ambulatory Visit (HOSPITAL_COMMUNITY): Payer: Self-pay

## 2022-05-06 ENCOUNTER — Other Ambulatory Visit (HOSPITAL_COMMUNITY): Payer: Self-pay

## 2022-05-09 ENCOUNTER — Other Ambulatory Visit (HOSPITAL_COMMUNITY): Payer: Self-pay

## 2022-05-31 ENCOUNTER — Other Ambulatory Visit (HOSPITAL_COMMUNITY): Payer: Self-pay

## 2022-05-31 MED ORDER — SAXENDA 18 MG/3ML ~~LOC~~ SOPN
3.0000 mg | PEN_INJECTOR | Freq: Every day | SUBCUTANEOUS | 0 refills | Status: AC
  Filled 2022-05-31: qty 15, 30d supply, fill #0

## 2022-06-01 ENCOUNTER — Other Ambulatory Visit (HOSPITAL_COMMUNITY): Payer: Self-pay

## 2022-06-27 ENCOUNTER — Other Ambulatory Visit (HOSPITAL_COMMUNITY): Payer: Self-pay

## 2022-07-04 ENCOUNTER — Other Ambulatory Visit (HOSPITAL_COMMUNITY): Payer: Self-pay

## 2022-07-04 ENCOUNTER — Other Ambulatory Visit: Payer: Self-pay

## 2022-07-04 MED ORDER — SAXENDA 18 MG/3ML ~~LOC~~ SOPN
3.0000 mg | PEN_INJECTOR | Freq: Every day | SUBCUTANEOUS | 0 refills | Status: DC
  Filled 2022-07-04: qty 45, 90d supply, fill #0
  Filled 2022-07-08: qty 15, 30d supply, fill #0

## 2022-07-08 ENCOUNTER — Other Ambulatory Visit (HOSPITAL_COMMUNITY): Payer: Self-pay

## 2022-07-11 ENCOUNTER — Other Ambulatory Visit (HOSPITAL_COMMUNITY): Payer: Self-pay

## 2022-07-13 ENCOUNTER — Other Ambulatory Visit (HOSPITAL_COMMUNITY): Payer: Self-pay
